# Patient Record
Sex: Male | Born: 1955 | Race: White | Hispanic: No | Marital: Married | Smoking: Never smoker
Health system: Southern US, Community
[De-identification: ages and names within clinical notes are randomized; demographics above are authoritative.]

## PROBLEM LIST (undated history)

## (undated) DIAGNOSIS — E7800 Pure hypercholesterolemia, unspecified: Secondary | ICD-10-CM

## (undated) DIAGNOSIS — B019 Varicella without complication: Secondary | ICD-10-CM

## (undated) DIAGNOSIS — N529 Male erectile dysfunction, unspecified: Secondary | ICD-10-CM

## (undated) DIAGNOSIS — K635 Polyp of colon: Secondary | ICD-10-CM

## (undated) HISTORY — DX: Polyp of colon: K63.5

## (undated) HISTORY — DX: Varicella without complication: B01.9

## (undated) HISTORY — DX: Pure hypercholesterolemia, unspecified: E78.00

## (undated) HISTORY — PX: GANGLION CYST EXCISION: SHX1691

## (undated) HISTORY — DX: Male erectile dysfunction, unspecified: N52.9

---

## 2015-06-07 ENCOUNTER — Encounter: Payer: Self-pay | Admitting: Adult Health

## 2015-06-07 ENCOUNTER — Ambulatory Visit (INDEPENDENT_AMBULATORY_CARE_PROVIDER_SITE_OTHER): Payer: Self-pay | Admitting: Adult Health

## 2015-06-07 VITALS — BP 110/78 | Temp 98.0°F | Ht 72.0 in | Wt 197.0 lb

## 2015-06-07 DIAGNOSIS — Z7189 Other specified counseling: Secondary | ICD-10-CM | POA: Diagnosis not present

## 2015-06-07 DIAGNOSIS — E7800 Pure hypercholesterolemia, unspecified: Secondary | ICD-10-CM | POA: Insufficient documentation

## 2015-06-07 DIAGNOSIS — Z7689 Persons encountering health services in other specified circumstances: Secondary | ICD-10-CM

## 2015-06-07 MED ORDER — ATORVASTATIN CALCIUM 40 MG PO TABS: 40.0000 mg | ORAL_TABLET | Freq: Every day | ORAL | Status: DC

## 2015-06-07 NOTE — Progress Notes (Deleted)
   Subjective:    Patient ID: Darryl MuraGlenn Scott, male    DOB: 02/04/1956, 60 y.o.   MRN: [id number]  HPI    Review of Systems     Objective:   Physical Exam        Assessment & Plan:

## 2015-06-07 NOTE — Patient Instructions (Addendum)
It was great meeting you today!  Follow up in March for your physical, but if you need anything in the meantime, please let me know.   Continue to work on diet and exercise.     Health Maintenance, Male A healthy lifestyle and preventative care can promote health and wellness.  Maintain regular health, dental, and eye exams.  Eat a healthy diet. Foods like vegetables, fruits, whole grains, low-fat dairy products, and lean protein foods contain the nutrients you need and are low in calories. Decrease your intake of foods high in solid fats, added sugars, and salt. Get information about a proper diet from your health care provider, if necessary.  Regular physical exercise is one of the most important things you can do for your health. Most adults should get at least 150 minutes of moderate-intensity exercise (any activity that increases your heart rate and causes you to sweat) each week. In addition, most adults need muscle-strengthening exercises on 2 or more days a week.   Maintain a healthy weight. The body mass index (BMI) is a screening tool to identify possible weight problems. It provides an estimate of body fat based on height and weight. Your health care provider can find your BMI and can help you achieve or maintain a healthy weight. For males 20 years and older:  A BMI below 18.5 is considered underweight.  A BMI of 18.5 to 24.9 is normal.  A BMI of 25 to 29.9 is considered overweight.  A BMI of 30 and above is considered obese.  Maintain normal blood lipids and cholesterol by exercising and minimizing your intake of saturated fat. Eat a balanced diet with plenty of fruits and vegetables. Blood tests for lipids and cholesterol should begin at age 62 and be repeated every 5 years. If your lipid or cholesterol levels are high, you are over age 62, or you are at high risk for heart disease, you may need your cholesterol levels checked more frequently.Ongoing high lipid and  cholesterol levels should be treated with medicines if diet and exercise are not working.  If you smoke, find out from your health care provider how to quit. If you do not use tobacco, do not start.  Lung cancer screening is recommended for adults aged 55-80 years who are at high risk for developing lung cancer because of a history of smoking. A yearly low-dose CT scan of the lungs is recommended for people who have at least a 30-pack-year history of smoking and are current smokers or have quit within the past 15 years. A pack year of smoking is smoking an average of 1 pack of cigarettes a day for 1 year (for example, a 30-pack-year history of smoking could mean smoking 1 pack a day for 30 years or 2 packs a day for 15 years). Yearly screening should continue until the smoker has stopped smoking for at least 15 years. Yearly screening should be stopped for people who develop a health problem that would prevent them from having lung cancer treatment.  If you choose to drink alcohol, do not have more than 2 drinks per day. One drink is considered to be 12 oz (360 mL) of beer, 5 oz (150 mL) of wine, or 1.5 oz (45 mL) of liquor.  Avoid the use of street drugs. Do not share needles with anyone. Ask for help if you need support or instructions about stopping the use of drugs.  High blood pressure causes heart disease and increases the risk of stroke.  High blood pressure is more likely to develop in:  People who have blood pressure in the end of the normal range (100-139/85-89 mm Hg).  People who are overweight or obese.  People who are African American.  If you are 53-75 years of age, have your blood pressure checked every 3-5 years. If you are 37 years of age or older, have your blood pressure checked every year. You should have your blood pressure measured twice--once when you are at a hospital or clinic, and once when you are not at a hospital or clinic. Record the average of the two measurements. To  check your blood pressure when you are not at a hospital or clinic, you can use:  An automated blood pressure machine at a pharmacy.  A home blood pressure monitor.  If you are 45-3 years old, ask your health care provider if you should take aspirin to prevent heart disease.  Diabetes screening involves taking a blood sample to check your fasting blood sugar level. This should be done once every 3 years after age 53 if you are at a normal weight and without risk factors for diabetes. Testing should be considered at a younger age or be carried out more frequently if you are overweight and have at least 1 risk factor for diabetes.  Colorectal cancer can be detected and often prevented. Most routine colorectal cancer screening begins at the age of 38 and continues through age 69. However, your health care provider may recommend screening at an earlier age if you have risk factors for colon cancer. On a yearly basis, your health care provider may provide home test kits to check for hidden blood in the stool. A small camera at the end of a tube may be used to directly examine the colon (sigmoidoscopy or colonoscopy) to detect the earliest forms of colorectal cancer. Talk to your health care provider about this at age 76 when routine screening begins. A direct exam of the colon should be repeated every 5-10 years through age 52, unless early forms of precancerous polyps or small growths are found.  People who are at an increased risk for hepatitis B should be screened for this virus. You are considered at high risk for hepatitis B if:  You were born in a country where hepatitis B occurs often. Talk with your health care provider about which countries are considered high risk.  Your parents were born in a high-risk country and you have not received a shot to protect against hepatitis B (hepatitis B vaccine).  You have HIV or AIDS.  You use needles to inject street drugs.  You live with, or have sex  with, someone who has hepatitis B.  You are a man who has sex with other men (MSM).  You get hemodialysis treatment.  You take certain medicines for conditions like cancer, organ transplantation, and autoimmune conditions.  Hepatitis C blood testing is recommended for all people born from [birth year] through 1965 and any individual with known risk factors for hepatitis C.  Healthy men should no longer receive prostate-specific antigen (PSA) blood tests as part of routine cancer screening. Talk to your health care provider about prostate cancer screening.  Testicular cancer screening is not recommended for adolescents or adult males who have no symptoms. Screening includes self-exam, a health care provider exam, and other screening tests. Consult with your health care provider about any symptoms you have or any concerns you have about testicular cancer.  Practice safe sex. Use condoms  and avoid high-risk sexual practices to reduce the spread of sexually transmitted infections (STIs).  You should be screened for STIs, including gonorrhea and chlamydia if:  You are sexually active and are younger than 24 years.  You are older than 24 years, and your health care provider tells you that you are at risk for this type of infection.  Your sexual activity has changed since you were last screened, and you are at an increased risk for chlamydia or gonorrhea. Ask your health care provider if you are at risk.  If you are at risk of being infected with HIV, it is recommended that you take a prescription medicine daily to prevent HIV infection. This is called pre-exposure prophylaxis (PrEP). You are considered at risk if:  You are a man who has sex with other men (MSM).  You are a heterosexual man who is sexually active with multiple partners.  You take drugs by injection.  You are sexually active with a partner who has HIV.  Talk with your health care provider about whether you are at high risk of being  infected with HIV. If you choose to begin PrEP, you should first be tested for HIV. You should then be tested every 3 months for as long as you are taking PrEP.  Use sunscreen. Apply sunscreen liberally and repeatedly throughout the day. You should seek shade when your shadow is shorter than you. Protect yourself by wearing long sleeves, pants, a wide-brimmed hat, and sunglasses year round whenever you are outdoors.  Tell your health care provider of new moles or changes in moles, especially if there is a change in shape or color. Also, tell your health care provider if a mole is larger than the size of a pencil eraser.  A one-time screening for abdominal aortic aneurysm (AAA) and surgical repair of large AAAs by ultrasound is recommended for men aged 25-75 years who are current or former smokers.  Stay current with your vaccines (immunizations).   This information is not intended to replace advice given to you by your health care provider. Make sure you discuss any questions you have with your health care provider.   Document Released: 11/04/2007 Document Revised: 05/29/2014 Document Reviewed: 10/03/2010 Elsevier Interactive Patient Education [employer].

## 2015-06-07 NOTE — Progress Notes (Signed)
HPI:  Darryl Scott is here to establish care. He is a very pleasant and active caucasian male who  has a past medical history of Chicken pox; High cholesterol; and Colon polyps. He has recently moved from the [place] area to [place] for work.    Last PCP and physical:Within last year. With Dr. Burman Blacksmith at [facility].    Has the following chronic problems that require follow up and concerns today:   Hypercholesterolemia  He has been taking Lipitor for this issue and has never had an issue before.   ROS negative for unless reported above: fevers, chills,feeling poorly, unintentional weight loss, hearing or vision loss, chest pain, palpitations, leg claudication, struggling to breath,Not feeling congested in the chest, no orthopenia, no cough,no wheezing, normal appetite, no soft tissue swelling, no hemoptysis, melena, hematochezia, hematuria, falls, loc, si, or thoughts of self harm.  Immunizations: Vaccinations Diet: Eats healthy Exercise: Runs three times a week. He likes to lift weights.  Colonoscopy: 2016 - One polyp   Past Medical History  Diagnosis Date  . Chicken pox   . High cholesterol   . Colon polyps     Past Surgical History  Procedure Laterality Date  . Ganglion cyst excision      Family History  Problem Relation Age of Onset  . Prostate cancer Father   . Elevated Lipids Father     Social History   Social History  . Marital Status: Married    Spouse Name: N/A  . Number of Children: N/A  . Years of Education: N/A   Social History Main Topics  . Smoking status: Never Smoker   . Smokeless tobacco: None  . Alcohol Use: 0.0 oz/week    0 Standard drinks or equivalent per week     Comment: per pt 1 beer a week   . Drug Use: No  . Sexual Activity: Not Asked   Other Topics Concern  . None   Social History Narrative     Current outpatient prescriptions:  .  aspirin 81 MG tablet, Take 81 mg by mouth daily., Disp: , Rfl:  .  atorvastatin  (LIPITOR) 40 MG tablet, Take 1 tablet (40 mg total) by mouth daily., Disp: 90 tablet, Rfl: 1  EXAM:  Filed Vitals:   06/07/15 0900  BP: 110/78  Temp: 98 F (36.7 C)    Body mass index is 26.71 kg/(m^2).  GENERAL: vitals reviewed and listed above, alert, oriented, appears well hydrated and in no acute distress  HEENT: atraumatic, conjunttiva clear, no obvious abnormalities on inspection of external nose and ears  NECK: Neck is soft and supple without masses, no adenopathy or thyromegaly, trachea midline, no JVD. Normal range of motion.   LUNGS: clear to auscultation bilaterally, no wheezes, rales or rhonchi, good air movement  CV: Regular rate and rhythm, normal S1/S2, no audible murmurs, gallops, or rubs. No carotid bruit and no peripheral edema.   MS: moves all extremities without noticeable abnormality. No edema noted  Abd: soft/nontender/nondistended/normal bowel sounds   Skin: warm and dry, no rash   Extremities: No clubbing, cyanosis, or edema. Capillary refill is WNL. Pulses intact bilaterally in upper and lower extremities.   Neuro: CN II-XII intact, sensation and reflexes normal throughout, 5/5 muscle strength in bilateral upper and lower extremities. Normal finger to nose. Normal rapid alternating movements. Normal romberg. No pronator drift.   PSYCH: pleasant and cooperative, no obvious depression or anxiety  ASSESSMENT AND PLAN:  1. Encounter to establish  care - Follow up in March for CPE - Follow up sooner if needed - Work on diet and exercise.   2. Pure hypercholesterolemia - atorvastatin (LIPITOR) 40 MG tablet; Take 1 tablet (40 mg total) by mouth daily.  Dispense: 90 tablet; Refill: 1   -We reviewed the PMH, PSH, FH, SH, Meds and Allergies. -We provided refills for any medications we will prescribe as needed. -We addressed current concerns per orders and patient instructions. -We have asked for records for pertinent exams, studies, vaccines and notes  from previous providers. -We have advised patient to follow up per instructions below.   -Patient advised to return or notify a provider immediately if symptoms worsen or persist or new concerns arise.    [employer]ory Andranik Jeune

## 2015-06-07 NOTE — Progress Notes (Signed)
Pre visit review using our clinic review tool, if applicable. No additional management support is needed unless otherwise documented below in the visit note. 

## 2015-07-26 ENCOUNTER — Ambulatory Visit (INDEPENDENT_AMBULATORY_CARE_PROVIDER_SITE_OTHER): Payer: PRIVATE HEALTH INSURANCE | Admitting: Adult Health

## 2015-07-26 ENCOUNTER — Encounter: Payer: Self-pay | Admitting: Adult Health

## 2015-07-26 VITALS — BP 120/80 | Temp 98.0°F | Ht 72.0 in | Wt 196.2 lb

## 2015-07-26 DIAGNOSIS — R001 Bradycardia, unspecified: Secondary | ICD-10-CM

## 2015-07-26 DIAGNOSIS — M25512 Pain in left shoulder: Secondary | ICD-10-CM | POA: Diagnosis not present

## 2015-07-26 DIAGNOSIS — Z0001 Encounter for general adult medical examination with abnormal findings: Secondary | ICD-10-CM

## 2015-07-26 DIAGNOSIS — Z0000 Encounter for general adult medical examination without abnormal findings: Secondary | ICD-10-CM | POA: Diagnosis not present

## 2015-07-26 DIAGNOSIS — E7800 Pure hypercholesterolemia, unspecified: Secondary | ICD-10-CM | POA: Diagnosis not present

## 2015-07-26 LAB — HEPATIC FUNCTION PANEL
ALT: 22 U/L (ref 0–53)
AST: 26 U/L (ref 0–37)
Albumin: 4.4 g/dL (ref 3.5–5.2)
Alkaline Phosphatase: 74 U/L (ref 39–117)
BILIRUBIN TOTAL: 0.7 mg/dL (ref 0.2–1.2)
Bilirubin, Direct: 0.1 mg/dL (ref 0.0–0.3)
Total Protein: 7.4 g/dL (ref 6.0–8.3)

## 2015-07-26 LAB — CBC
HCT: 47.1 % (ref 39.0–52.0)
HEMOGLOBIN: 16 g/dL (ref 13.0–17.0)
MCHC: 34 g/dL (ref 30.0–36.0)
MCV: 89.3 fl (ref 78.0–100.0)
PLATELETS: 220 10*3/uL (ref 150.0–400.0)
RBC: 5.28 Mil/uL (ref 4.22–5.81)
RDW: 13.2 % (ref 11.5–15.5)
WBC: 7.7 10*3/uL (ref 4.0–10.5)

## 2015-07-26 LAB — POC URINALSYSI DIPSTICK (AUTOMATED)
BILIRUBIN UA: NEGATIVE
Glucose, UA: NEGATIVE
KETONES UA: NEGATIVE
Leukocytes, UA: NEGATIVE
Nitrite, UA: NEGATIVE
PH UA: 5.5
Protein, UA: NEGATIVE
Spec Grav, UA: 1.025
Urobilinogen, UA: 0.2

## 2015-07-26 LAB — HEMOGLOBIN A1C: HEMOGLOBIN A1C: 5.8 % (ref 4.6–6.5)

## 2015-07-26 LAB — BASIC METABOLIC PANEL
BUN: 18 mg/dL (ref 6–23)
CALCIUM: 9.5 mg/dL (ref 8.4–10.5)
CO2: 27 mEq/L (ref 19–32)
CREATININE: 1.12 mg/dL (ref 0.40–1.50)
Chloride: 104 mEq/L (ref 96–112)
GFR: 70.63 mL/min (ref >60.00)
Glucose, Bld: 97 mg/dL (ref 70–99)
Potassium: 4.8 mEq/L (ref 3.5–5.1)
Sodium: 139 mEq/L (ref 135–145)

## 2015-07-26 LAB — PSA: PSA: 1.42 ng/mL (ref 0.10–4.00)

## 2015-07-26 LAB — LIPID PANEL
CHOL/HDL RATIO: 3
Cholesterol: 163 mg/dL (ref 0–200)
HDL: 50.5 mg/dL (ref >39.00)
LDL CALC: 99 mg/dL (ref 0–99)
NONHDL: 112.27
Triglycerides: 67 mg/dL (ref 0.0–149.0)
VLDL: 13.4 mg/dL (ref 0.0–40.0)

## 2015-07-26 LAB — TSH: TSH: 1.85 u[IU]/mL (ref 0.35–4.50)

## 2015-07-26 MED ORDER — CYCLOBENZAPRINE HCL 10 MG PO TABS: 10.0000 mg | ORAL_TABLET | Freq: Three times a day (TID) | ORAL | Status: DC | PRN

## 2015-07-26 NOTE — Patient Instructions (Signed)
It was great seeing you today!  I will follow up with you regarding your blood work.   I have sent in a prescription for Flexeril 10 mg, take this at night first to see how it makes you feel.   Someone will call you to schedule your appointment for Sports Medicine.   Follow up in one year or sooner if needed.   Enjoy your summer on the trails.

## 2015-07-26 NOTE — Progress Notes (Signed)
Subjective:    Patient ID: Darryl Scott, male    DOB: 11/09/1955, 60 y.o.   MRN: [id number]  HPI  Patient presents for yearly preventative medicine examination.   All immunizations and health maintenance protocols were reviewed with the patient and needed orders were placed.  Appropriate screening laboratory values were ordered for the patient including screening of hyperlipidemia, renal function and hepatic function. If indicated by BPH, a PSA was ordered.  Medication reconciliation,  past medical history, social history, problem list and allergies were reviewed in detail with the patient  Goals were established with regard to weight loss, exercise, and  diet in compliance with medications  End of life planning was discussed. He has an advanced directive and living will.   Acute Issues - He has had left shoulder pain for 5 months. He contributed his pain to his recent move, but feels as though 5 months is to long to have this pain. He has limited range of motion and when he lifts his arm from his side he can go about half way before he has pain. The pain radiates down the side of his deltoid. Does not feel like the pain is coming from the inside of his shoulder. No pain at rest but has pain when he sleeps on that side.     Review of Systems  Constitutional: Negative.   HENT: Negative.   Eyes: Negative.   Respiratory: Negative.   Cardiovascular: Negative.   Gastrointestinal: Negative.   Endocrine: Negative.   Genitourinary: Negative.   Musculoskeletal: Positive for myalgias.  Allergic/Immunologic: Negative.   Neurological: Negative.   Hematological: Negative.   Psychiatric/Behavioral: Negative.   All other systems reviewed and are negative.  Past Medical History  Diagnosis Date  . Chicken pox   . High cholesterol   . Colon polyps     Social History   Social History  . Marital Status: Married    Spouse Name: N/A  . Number of Children: N/A  . Years of Education:  N/A   Occupational History  . Not on file.   Social History Main Topics  . Smoking status: Never Smoker   . Smokeless tobacco: Not on file  . Alcohol Use: 0.0 oz/week    0 Standard drinks or equivalent per week     Comment: per pt 1 beer a week   . Drug Use: No  . Sexual Activity: Not on file   Other Topics Concern  . 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    Married for 36 years    One child, she lives in [place].        Past Surgical History  Procedure Laterality Date  . Ganglion cyst excision      Family History  Problem Relation Age of Onset  . Prostate cancer Father   . Elevated Lipids Father     No Known Allergies  Current Outpatient Prescriptions on File Prior to Visit  Medication Sig Dispense Refill  . aspirin 81 MG tablet Take 81 mg by mouth daily.    Marland Kitchen. atorvastatin (LIPITOR) 40 MG tablet Take 1 tablet (40 mg total) by mouth daily. 90 tablet 1   No current facility-administered medications on file prior to visit.    BP 120/80 mmHg  Temp(Src) 98 F (36.7 C) (Oral)  Ht 6' (1.829 m)  Wt 196 lb 3.2 oz (88.996 kg)  BMI 26.60 kg/m2  Objective:   Physical Exam  Constitutional: He is oriented to person, place, and time. He appears well-developed and well-nourished. No distress.  HENT:  Head: Normocephalic and atraumatic.  Right Ear: External ear normal.  Left Ear: External ear normal.  Nose: Nose normal.  Mouth/Throat: Oropharynx is clear and moist. No oropharyngeal exudate.  Eyes: Right eye exhibits no discharge. Left eye exhibits no discharge.  Neck: Normal range of motion. Neck supple. No tracheal deviation present.  Cardiovascular: Normal rate, regular rhythm, normal heart sounds and intact distal pulses.  Exam reveals no gallop and no friction rub.   No murmur heard. Pulmonary/Chest: Effort normal and breath sounds normal. No respiratory distress. He has no wheezes. He has  no rales. He exhibits no tenderness.  Abdominal: Soft. Bowel sounds are normal. He exhibits no distension. There is no tenderness. There is no rebound and no guarding.  Genitourinary: Rectum normal, prostate normal and penis normal. Guaiac negative stool. No penile tenderness.  asymmetrical prostate -has been told this before and has had biopsy. No masses or lumps felt.   Musculoskeletal: He exhibits no edema or tenderness.       Left shoulder: He exhibits decreased range of motion, pain and decreased strength. He exhibits no tenderness, no bony tenderness, no swelling, no effusion, no crepitus, no deformity and no spasm.  Lymphadenopathy:    He has no cervical adenopathy.  Neurological: He is alert and oriented to person, place, and time. He has normal reflexes. He displays normal reflexes. No cranial nerve deficit. He exhibits normal muscle tone. Coordination normal.  Skin: Skin is warm and dry. No rash noted. No erythema. No pallor.  Psychiatric: He has a normal mood and affect. His behavior is normal. Judgment and thought content normal.  Nursing note and vitals reviewed.     Assessment & Plan:  1. Routine general medical examination at a health care facility - Lipid panel - TSH - Hepatic function panel - Hemoglobin A1c - EKG 12-Lead- Sinus  Bradycardia, Rate 58 - CBC - Basic metabolic panel - PSA - Hep C Antibody - Follow up in one year or sooner if needed 2. Pure hypercholesterolemia - Lipid panel - TSH - Hepatic function panel - Hemoglobin A1c - EKG 12-Lead - CBC - Basic metabolic panel - PSA 3. Left shoulder pain - Does not appear to be rotator cuff, seems more tendon or ligament in nature. Possibly muscle strain but this appears to be a long time of recovery for muscle strain. Do not think it is bursitis.  - Ambulatory referral to Sports Medicine - cyclobenzaprine (FLEXERIL) 10 MG tablet; Take 1 tablet (10 mg total) by mouth 3 (three) times daily as needed for muscle  spasms.  Dispense: 30 tablet; Refill: 0

## 2015-07-26 NOTE — Addendum Note (Signed)
Addended by: Nancy FetterNAFZIGER, Bearl Talarico L on: 07/26/2015 09:11 AM   Modules accepted: Orders, SmartSet

## 2015-07-26 NOTE — Progress Notes (Signed)
Pre visit review using our clinic review tool, if applicable. No additional management support is needed unless otherwise documented below in the visit note. 

## 2015-07-27 LAB — HEPATITIS C ANTIBODY: HCV AB: NEGATIVE

## 2015-08-10 ENCOUNTER — Ambulatory Visit: Payer: PRIVATE HEALTH INSURANCE | Admitting: Family Medicine

## 2015-09-23 ENCOUNTER — Encounter: Payer: Self-pay | Admitting: Family Medicine

## 2015-09-23 ENCOUNTER — Ambulatory Visit (INDEPENDENT_AMBULATORY_CARE_PROVIDER_SITE_OTHER): Payer: Managed Care, Other (non HMO) | Admitting: Family Medicine

## 2015-09-23 ENCOUNTER — Ambulatory Visit (INDEPENDENT_AMBULATORY_CARE_PROVIDER_SITE_OTHER)
Admission: RE | Admit: 2015-09-23 | Discharge: 2015-09-23 | Disposition: A | Discharge status: Home / Self Care | Payer: Managed Care, Other (non HMO) | Source: Ambulatory Visit | Attending: Family Medicine | Admitting: Family Medicine

## 2015-09-23 ENCOUNTER — Ambulatory Visit: Payer: Self-pay

## 2015-09-23 VITALS — BP 130/80 | HR 77 | Ht 72.0 in | Wt 196.0 lb

## 2015-09-23 DIAGNOSIS — M25512 Pain in left shoulder: Secondary | ICD-10-CM

## 2015-09-23 DIAGNOSIS — M75122 Complete rotator cuff tear or rupture of left shoulder, not specified as traumatic: Secondary | ICD-10-CM | POA: Insufficient documentation

## 2015-09-23 NOTE — Assessment & Plan Note (Signed)
She does have what appears to be a very large tear. Significant weakness and loss of motion. I do think that x-rays as well as advance imaging is necessary. Patient will likely need surgical intervention. We discussed over-the-counter medicines. Patient declined any other medications to help with pain. We discussed if worsening symptoms to seek medical attention. We will answer any questions patient has from here on out and we will give him results of MRI.

## 2015-09-23 NOTE — Patient Instructions (Signed)
Good to see you  Sorry for the bad news Ice 20 minutes 2 times daily. Usually after activity and before bed. I am pretty positive you have a large rotator cuff tear Vitamin D 2000 IU daily  Turmeric 500mg  twice daily  Xray downstairs  MRI will call you  I will send message after MRI and tell you next step

## 2015-09-23 NOTE — Progress Notes (Signed)
Pre visit review using our clinic review tool, if applicable. No additional management support is needed unless otherwise documented below in the visit note. 

## 2015-09-23 NOTE — Progress Notes (Signed)
[facility] [address] [place], [place] [address] Phone: [phone] Subjective:    I'm seeing this patient by the request  of:  Shirline Frees, NP   CC: left shoulder pain  BJY:NWGNFAOZHY Darryl Scott is a 60 y.o. male coming in with complaint of left shoulder pain. Been going on for proximally 7 months. Patient was moving boxes to move here and may be had some type injury. Seems to be worsening over the course of time. Patient is actually lacking more range of motion now as well. Patient rates the severity of pain as 9 out of 10. Feels that he has significant weakness as well. Patient is noticing that it is affecting daily activities. Unable to do anything more than his regular daily activities but is having trouble with even dressing. Mild radiation down the arm from time to time which is more of a throbbing sensation but denies any neck pain. Has tried ibuprofen a couple times but tries to avoid any type of medications.     Past Medical History  Diagnosis Date  . Chicken pox   . High cholesterol   . Colon polyps    Past Surgical History  Procedure Laterality Date  . Ganglion cyst excision     Social History   Social History  . Marital Status: Married    Spouse Name: N/A  . Number of Children: N/A  . Years of Education: N/A   Social History Main Topics  . Smoking status: Never Smoker   . Smokeless tobacco: None  . Alcohol Use: 0.0 oz/week    0 Standard drinks or equivalent per week     Comment: per pt 1 beer a week   . Drug Use: No  . Sexual Activity: Not Asked   Other Topics Concern  . 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    Married for 36 years    One child, she lives in [place].       No Known Allergies Family History  Problem Relation Age of Onset  . Prostate cancer Father   . Elevated Lipids Father     Past medical history, social, surgical and family history all  reviewed in electronic medical record.  No pertanent information unless stated regarding to the chief complaint.   Review of Systems: No headache, visual changes, nausea, vomiting, diarrhea, constipation, dizziness, abdominal pain, skin rash, fevers, chills, night sweats, weight loss, swollen lymph nodes, body aches, joint swelling, muscle aches, chest pain, shortness of breath, mood changes.   Objective Blood pressure 130/80, pulse 77, height 6' (1.829 m), weight 196 lb (88.905 kg), SpO2 97 %.  General: No apparent distress alert and oriented x3 mood and affect normal, dressed appropriately.  HEENT: Pupils equal, extraocular movements intact  Respiratory: Patient's speak in full sentences and does not appear short of breath  Cardiovascular: No lower extremity edema, non tender, no erythema  Skin: Warm dry intact with no signs of infection or rash on extremities or on axial skeleton.  Abdomen: Soft nontender  Neuro: Cranial nerves II through XII are intact, neurovascularly intact in all extremities with 2+ DTRs and 2+ pulses.  Lymph: No lymphadenopathy of posterior or anterior cervical chain or axillae bilaterally.  Gait normal with good balance and coordination.  MSK:  Non tender with full range of motion and good stability and symmetric strength and tone of elbows, wrist, hip, knee and ankles  bilaterally.  Shoulder: left Inspection reveals no abnormalities, atrophy or asymmetry. Palpation is normal with no tenderness over AC joint or bicipital groove. Decreased range of motion lacking the last 20 of forward flexion, internal rotation only to lateral hip Rotation of 5 Rotator cuff strength normal throughout. signs of impingement with positive Neer and Hawkin's tests, but negative empty can sign. Speeds and Yergason's tests normal. Positive O'Brien's Normal scapular function observed. Positive drop arm and painful arc Positive apprehension Contralateral shoulder unremarkable  MSK US  performed of: left This study was ordered, performed, and interpreted by Terrilee FilesZach Varvara Legault D.O.  Shoulder:   Supraspinatus:  Large full-thickness tear with a proximal a 1 cm of retraction noted. Atrophy and some fat deposition in the tenderness noted Infraspinatus:  Appears normal on long and transverse views. Significant increase in Doppler flow Subscapularis:  Partial tear noted with increasing hypoechoic changes. Teres Minor:  Appears normal on long and transverse views. AC joint:  Mild arthritis Glenohumeral Joint:  Appears normal without effusion. Glenoid Labrum:  Intact without visualized tears. Biceps Tendon: hypoechoic changes but no true tear  Impression: large retracted full-thickness supraspinatus tear      Impression and Recommendations:     This case required medical decision making of moderate complexity.      Note: This dictation was prepared with Dragon dictation along with smaller phrase technology. Any transcriptional errors that result from this process are unintentional.

## 2015-09-30 ENCOUNTER — Ambulatory Visit
Admission: RE | Admit: 2015-09-30 | Discharge: 2015-09-30 | Disposition: A | Discharge status: Home / Self Care | Payer: Managed Care, Other (non HMO) | Source: Ambulatory Visit | Attending: Family Medicine | Admitting: Family Medicine

## 2015-09-30 DIAGNOSIS — M25512 Pain in left shoulder: Secondary | ICD-10-CM

## 2015-10-07 ENCOUNTER — Ambulatory Visit (INDEPENDENT_AMBULATORY_CARE_PROVIDER_SITE_OTHER): Payer: Managed Care, Other (non HMO) | Admitting: Family Medicine

## 2015-10-07 ENCOUNTER — Encounter: Payer: Self-pay | Admitting: Family Medicine

## 2015-10-07 ENCOUNTER — Other Ambulatory Visit: Payer: Self-pay

## 2015-10-07 VITALS — BP 134/86 | HR 104 | Ht 72.0 in | Wt 196.0 lb

## 2015-10-07 DIAGNOSIS — M7500 Adhesive capsulitis of unspecified shoulder: Secondary | ICD-10-CM | POA: Insufficient documentation

## 2015-10-07 DIAGNOSIS — M25512 Pain in left shoulder: Secondary | ICD-10-CM | POA: Diagnosis not present

## 2015-10-07 DIAGNOSIS — M19019 Primary osteoarthritis, unspecified shoulder: Secondary | ICD-10-CM | POA: Insufficient documentation

## 2015-10-07 DIAGNOSIS — M129 Arthropathy, unspecified: Secondary | ICD-10-CM | POA: Diagnosis not present

## 2015-10-07 DIAGNOSIS — M7502 Adhesive capsulitis of left shoulder: Secondary | ICD-10-CM

## 2015-10-07 NOTE — Assessment & Plan Note (Signed)
Patient was given an injection today. Tolerated the procedure well with some mild increase in range of motion immediately. Patient will respond. Referred to formal physical therapy for the frozen shoulder as well. 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 I did this is less likely to give him the pain but will consider it. Patient will return and see me again in 3-4 weeks for further evaluation.

## 2015-10-07 NOTE — Progress Notes (Signed)
[facility] [address] [place], [place] [address] Phone: [phone] Subjective:    I'm seeing this patient by the request  of:  Shirline Freesory Nafziger, NP   CC: left shoulder pain f/u   BJY:NWGNFAOZHYHPI:Subjective Rhetta MuraGlenn Bellerose is a 60 y.o. male coming in with complaint of left shoulder pain.appeared on ultrasound the patient did have a full-thickness tear. Patient elected to have an MRI. Patient's MRI showed that he only had mild tendinosis as well as a large bursitis. Patient is here for further evaluation. Still has not had any improvement in range of motion or strength. Continues to have the pain. Wondering what is next.     Past Medical History  Diagnosis Date  . Chicken pox   . High cholesterol   . Colon polyps    Past Surgical History  Procedure Laterality Date  . Ganglion cyst excision     Social History   Social History  . Marital Status: Married    Spouse Name: N/A  . Number of Children: N/A  . Years of Education: N/A   Social History Main Topics  . Smoking status: Never Smoker   . Smokeless tobacco: None  . Alcohol Use: 0.0 oz/week    0 Standard drinks or equivalent per week     Comment: per pt 1 beer a week   . Drug Use: No  . Sexual Activity: Not Asked   Other Topics Concern  . 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    Married for 36 years    One child, she lives in [place].       No Known Allergies Family History  Problem Relation Age of Onset  . Prostate cancer Father   . Elevated Lipids Father     Past medical history, social, surgical and family history all reviewed in electronic medical record.  No pertanent information unless stated regarding to the chief complaint.   Review of Systems: No headache, visual changes, nausea, vomiting, diarrhea, constipation, dizziness, abdominal pain, skin rash, fevers, chills, night sweats, weight loss, swollen lymph nodes, body aches,  joint swelling, muscle aches, chest pain, shortness of breath, mood changes.   Objective Blood pressure 134/86, pulse 104, height 6' (1.829 m), weight 196 lb (88.905 kg), SpO2 98 %.  General: No apparent distress alert and oriented x3 mood and affect normal, dressed appropriately.  HEENT: Pupils equal, extraocular movements intact  Respiratory: Patient's speak in full sentences and does not appear short of breath  Cardiovascular: No lower extremity edema, non tender, no erythema  Skin: Warm dry intact with no signs of infection or rash on extremities or on axial skeleton.  Abdomen: Soft nontender  Neuro: Cranial nerves II through XII are intact, neurovascularly intact in all extremities with 2+ DTRs and 2+ pulses.  Lymph: No lymphadenopathy of posterior or anterior cervical chain or axillae bilaterally.  Gait normal with good balance and coordination.  MSK:  Non tender with full range of motion and good stability and symmetric strength and tone of elbows, wrist, hip, knee and ankles bilaterally.  Shoulder: left Inspection reveals no abnormalities, atrophy or asymmetry. Palpation is normal with no tenderness over AC joint or bicipital groove. Decreased range of motion lacking the last 20 of forward flexion, internal rotation only to lateral hip Rotation of 5 Rotator cuff strength 4 out of 5 compared to the contralateral side signs of impingement with positive Neer and  Hawkin's tests, but negative empty can sign. Speeds and Yergason's tests normal. Positive O'Brien's Normal scapular function observed. Positive drop arm and painful arc Positive apprehension Contralateral shoulder unremarkable  Procedure: Real-time Ultrasound Guided Injection of left glenohumeral joint Device: GE Logiq E  Ultrasound guided injection is preferred based studies that show increased duration, increased effect, greater accuracy, decreased procedural pain, increased response rate with ultrasound guided versus  blind injection.  Verbal informed consent obtained.  Time-out conducted.  Noted no overlying erythema, induration, or other signs of local infection.  Skin prepped in a sterile fashion.  Local anesthesia: Topical Ethyl chloride.  With sterile technique and under real time ultrasound guidance:  Joint visualized.  23g 1  inch needle inserted posterior approach. Pictures taken for needle placement. Patient did have injection of 2 cc of 1% lidocaine, 2 cc of 0.5% Marcaine, and 1cc of Kenalog 40 mg/dL. Completed without difficulty  Pain immediately resolved suggesting accurate placement of the medication.  Advised to call if fevers/chills, erythema, induration, drainage, or persistent bleeding.  Images permanently stored and available for review in the ultrasound unit.  Impression: Technically successful ultrasound guided injection.     Impression and Recommendations:     This case required medical decision making of moderate complexity.      Note: This dictation was prepared with Dragon dictation along with smaller phrase technology. Any transcriptional errors that result from this process are unintentional.

## 2015-10-07 NOTE — Patient Instructions (Signed)
Good to see yo u Darryl Scott is your friend Stay active PT will be calling you I am hoping this is frozen shoulder See me again in 3-4 weeks and we will rescan again and make sure you are doig better.

## 2015-11-16 ENCOUNTER — Other Ambulatory Visit: Payer: Self-pay | Admitting: General Practice

## 2015-11-16 DIAGNOSIS — E7800 Pure hypercholesterolemia, unspecified: Secondary | ICD-10-CM

## 2015-11-16 MED ORDER — ATORVASTATIN CALCIUM 40 MG PO TABS: 40.0000 mg | ORAL_TABLET | Freq: Every day | ORAL | Status: DC

## 2016-06-06 ENCOUNTER — Telehealth: Payer: Self-pay | Admitting: Adult Health

## 2016-06-06 ENCOUNTER — Other Ambulatory Visit: Payer: Self-pay

## 2016-06-06 DIAGNOSIS — E7800 Pure hypercholesterolemia, unspecified: Secondary | ICD-10-CM

## 2016-06-06 MED ORDER — ATORVASTATIN CALCIUM 40 MG PO TABS: 40.0000 mg | ORAL_TABLET | Freq: Every day | ORAL | 1 refills | Status: DC

## 2016-06-06 NOTE — Telephone Encounter (Signed)
° ° ° °  Pt request refill of the following:  atorvastatin (LIPITOR) 40 MG tablet   Phamacy:   [facility]

## 2016-06-06 NOTE — Telephone Encounter (Signed)
Rx has been sent in. 

## 2016-07-31 ENCOUNTER — Other Ambulatory Visit (INDEPENDENT_AMBULATORY_CARE_PROVIDER_SITE_OTHER): Payer: Managed Care, Other (non HMO)

## 2016-07-31 DIAGNOSIS — Z0000 Encounter for general adult medical examination without abnormal findings: Secondary | ICD-10-CM | POA: Diagnosis not present

## 2016-07-31 LAB — LIPID PANEL
CHOLESTEROL: 135 mg/dL (ref 0–200)
HDL: 38.1 mg/dL — ABNORMAL LOW (ref >39.00)
LDL Cholesterol: 81 mg/dL (ref 0–99)
NonHDL: 96.4
Total CHOL/HDL Ratio: 4
Triglycerides: 78 mg/dL (ref 0.0–149.0)
VLDL: 15.6 mg/dL (ref 0.0–40.0)

## 2016-07-31 LAB — CBC WITH DIFFERENTIAL/PLATELET
BASOS ABS: 0.1 10*3/uL (ref 0.0–0.1)
Basophils Relative: 0.9 % (ref 0.0–3.0)
EOS PCT: 6.5 % — AB (ref 0.0–5.0)
Eosinophils Absolute: 0.5 10*3/uL (ref 0.0–0.7)
HEMATOCRIT: 48.4 % (ref 39.0–52.0)
HEMOGLOBIN: 16.3 g/dL (ref 13.0–17.0)
LYMPHS PCT: 26.1 % (ref 12.0–46.0)
Lymphs Abs: 1.9 10*3/uL (ref 0.7–4.0)
MCHC: 33.8 g/dL (ref 30.0–36.0)
MCV: 91 fl (ref 78.0–100.0)
MONOS PCT: 8.9 % (ref 3.0–12.0)
Monocytes Absolute: 0.6 10*3/uL (ref 0.1–1.0)
Neutro Abs: 4.1 10*3/uL (ref 1.4–7.7)
Neutrophils Relative %: 57.6 % (ref 43.0–77.0)
Platelets: 208 10*3/uL (ref 150.0–400.0)
RBC: 5.32 Mil/uL (ref 4.22–5.81)
RDW: 13.3 % (ref 11.5–15.5)
WBC: 7.1 10*3/uL (ref 4.0–10.5)

## 2016-07-31 LAB — POC URINALSYSI DIPSTICK (AUTOMATED)
Bilirubin, UA: NEGATIVE
GLUCOSE UA: NEGATIVE
Ketones, UA: NEGATIVE
Leukocytes, UA: NEGATIVE
NITRITE UA: NEGATIVE
PROTEIN UA: NEGATIVE
Spec Grav, UA: >=1.03
UROBILINOGEN UA: 0.2
pH, UA: 5

## 2016-07-31 LAB — BASIC METABOLIC PANEL
BUN: 16 mg/dL (ref 6–23)
CO2: 29 mEq/L (ref 19–32)
Calcium: 9.4 mg/dL (ref 8.4–10.5)
Chloride: 107 mEq/L (ref 96–112)
Creatinine, Ser: 1.13 mg/dL (ref 0.40–1.50)
GFR: 69.68 mL/min (ref >60.00)
GLUCOSE: 99 mg/dL (ref 70–99)
POTASSIUM: 4.8 meq/L (ref 3.5–5.1)
SODIUM: 141 meq/L (ref 135–145)

## 2016-07-31 LAB — HEPATIC FUNCTION PANEL
ALBUMIN: 4.1 g/dL (ref 3.5–5.2)
ALK PHOS: 77 U/L (ref 39–117)
ALT: 17 U/L (ref 0–53)
AST: 22 U/L (ref 0–37)
Bilirubin, Direct: 0.1 mg/dL (ref 0.0–0.3)
Total Bilirubin: 0.6 mg/dL (ref 0.2–1.2)
Total Protein: 7 g/dL (ref 6.0–8.3)

## 2016-08-01 LAB — PSA: PSA: 2.16 ng/mL (ref 0.10–4.00)

## 2016-08-01 LAB — TSH: TSH: 1.7 u[IU]/mL (ref 0.35–4.50)

## 2016-08-04 ENCOUNTER — Ambulatory Visit (INDEPENDENT_AMBULATORY_CARE_PROVIDER_SITE_OTHER): Payer: Managed Care, Other (non HMO) | Admitting: Adult Health

## 2016-08-04 VITALS — BP 132/84 | Temp 97.7°F | Ht 72.0 in | Wt 195.4 lb

## 2016-08-04 DIAGNOSIS — E7800 Pure hypercholesterolemia, unspecified: Secondary | ICD-10-CM

## 2016-08-04 DIAGNOSIS — N401 Enlarged prostate with lower urinary tract symptoms: Secondary | ICD-10-CM

## 2016-08-04 DIAGNOSIS — N138 Other obstructive and reflux uropathy: Secondary | ICD-10-CM

## 2016-08-04 DIAGNOSIS — N529 Male erectile dysfunction, unspecified: Secondary | ICD-10-CM

## 2016-08-04 DIAGNOSIS — Z0000 Encounter for general adult medical examination without abnormal findings: Secondary | ICD-10-CM | POA: Diagnosis not present

## 2016-08-04 DIAGNOSIS — H9313 Tinnitus, bilateral: Secondary | ICD-10-CM

## 2016-08-04 MED ORDER — TADALAFIL 20 MG PO TABS: 10.0000 mg | ORAL_TABLET | ORAL | 11 refills | Status: DC | PRN

## 2016-08-04 NOTE — Progress Notes (Signed)
Subjective:    Patient ID: Darryl Scott, male    DOB: 10-Nov-1955, 61 y.o.   MRN: [id number]  HPI  Patient presents for yearly preventative medicine examination. He is a pleasant 61 year old male who  has a past medical history of Chicken pox; Colon polyps; and High cholesterol.   All immunizations and health maintenance protocols were reviewed with the patient and needed orders were placed.  Appropriate screening laboratory values were ordered for the patient including screening of hyperlipidemia, renal function and hepatic function. If indicated by BPH, a PSA was ordered.  Medication reconciliation,  past medical history, social history, problem list and allergies were reviewed in detail with the patient  Goals were established with regard to weight loss, exercise, and  diet in compliance with medications. He exercises multiple times per week and eats a healthy diet  He has an advanced directive and living will.   His last colonoscopy was in 2016. He has seen his dentist and eye doctor this year.   Takes Lipitor for hyperlipidemia.   He reports that over the last 7 months he has had issues with urination. He reports that he has decreased stream, frequency, and feeling as though he is not emptying his bladder fully. He denies nocturia.   He also report that over the last 5 months he has had issues with getting an erection and maintaining an erection.   Tinnitus - noticed this happening daily " a few months ago" . Denies any trauma. Ringing is in both ears. He takes daily ASA.     Review of Systems  Constitutional: Negative.   HENT: Positive for tinnitus.   Eyes: Negative.   Respiratory: Negative.   Cardiovascular: Negative.   Gastrointestinal: Negative.   Endocrine: Negative.   Genitourinary: Positive for decreased urine volume, frequency and urgency.  Musculoskeletal: Negative.   Skin: Negative.   Allergic/Immunologic: Negative.   Neurological: Negative.   Hematological:  Negative.   Psychiatric/Behavioral: Negative.   All other systems reviewed and are negative.  Past Medical History:  Diagnosis Date  . Chicken pox   . Colon polyps   . High cholesterol     Social History   Social History  . Marital status: Married    Spouse name: N/A  . Number of children: N/A  . Years of education: N/A   Occupational History  . Not on file.   Social History Main Topics  . Smoking status: Never Smoker  . Smokeless tobacco: Not on file  . Alcohol use 0.0 oz/week     Comment: per pt 1 beer a week   . Drug use: No  . Sexual activity: Not on file   Other Topics Concern  . 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        Past Surgical History:  Procedure Laterality Date  . GANGLION CYST EXCISION      Family History  Problem Relation Age of Onset  . Prostate cancer Father   . Elevated Lipids Father     No Known Allergies  Current Outpatient Prescriptions on File Prior to Visit  Medication Sig Dispense Refill  . aspirin 81 MG tablet Take 81 mg by mouth daily.    Marland Kitchen atorvastatin (LIPITOR) 40 MG tablet Take 1 tablet (40 mg total) by mouth daily. 90 tablet 1  No current facility-administered medications on file prior to visit.     BP 132/84 (BP Location: Right Arm, Patient Position: Sitting, Cuff Size: Normal)   Temp 97.7 F (36.5 C) (Oral)   Ht 6' (1.829 m)   Wt 195 lb 6.4 oz (88.6 kg)   BMI 26.50 kg/m       Objective:   Physical Exam  Constitutional: He is oriented to person, place, and time. He appears well-developed and well-nourished. No distress.  HENT:  Head: Normocephalic and atraumatic.  Right Ear: External ear normal.  Left Ear: External ear normal.  Nose: Nose normal.  Mouth/Throat: Uvula is midline and oropharynx is clear and moist. No oropharyngeal exudate.  Eyes: Conjunctivae and EOM are normal.  Pupils are equal, round, and reactive to light. Right eye exhibits no discharge. Left eye exhibits no discharge. No scleral icterus.  Neck: Normal range of motion. Neck supple. No JVD present. No tracheal deviation present. No thyromegaly present.  Cardiovascular: Normal rate, regular rhythm, normal heart sounds and intact distal pulses.  Exam reveals no gallop and no friction rub.   No murmur heard. Pulmonary/Chest: Effort normal and breath sounds normal. No stridor. No respiratory distress. He has no wheezes. He has no rales. He exhibits no tenderness.  Abdominal: Soft. Bowel sounds are normal. He exhibits no distension and no mass. There is no tenderness. There is no rebound and no guarding.  Genitourinary: Rectum normal. Rectal exam shows guaiac negative stool. Prostate is enlarged. Prostate is not tender. No penile tenderness.  Genitourinary Comments: asymmetrical prostate. No masses or lumps felt  Musculoskeletal: Normal range of motion. He exhibits no edema, tenderness or deformity.  Lymphadenopathy:    He has no cervical adenopathy.  Neurological: He is alert and oriented to person, place, and time. He has normal reflexes. He displays normal reflexes. No cranial nerve deficit. He exhibits normal muscle tone. Coordination normal.  Skin: Skin is warm and dry. No rash noted. He is not diaphoretic. No erythema. No pallor.  Scattered AK's and nevi. No concern for malignancy   Psychiatric: He has a normal mood and affect. Judgment and thought content normal.  Nursing note and vitals reviewed.     Assessment & Plan:  1. Routine general medical examination at a health care facility - Labs reviewed with patient and all questions answered.  - Follow up in one year or sooner if needed - Continue to stay active and exercise   2. Pure hypercholesterolemia - Lipid panel WNL. Continue Lipitor  - Continue to eat healthy and exercise  3. BPH with obstruction/lower urinary tract symptoms - We  talked about medications and he does not find it an inconvenience to have to take medication daily. He would like to wait for this  - Follow up as needed 4. Erectile dysfunction, unspecified erectile dysfunction type  - tadalafil (CIALIS) 20 MG tablet; Take 0.5-1 tablets (10-20 mg total) by mouth every other day as needed for erectile dysfunction.  Dispense: 10 tablet; Refill: 11  5. Tinnitus of both ears - Stop ASA  - Follow up if no improvement. Will refer to Audiology if tinnitus resolved     Shirline Freesory Tanicia Wolaver, NP

## 2016-11-07 ENCOUNTER — Other Ambulatory Visit: Payer: Self-pay | Admitting: Family Medicine

## 2016-11-07 DIAGNOSIS — E7800 Pure hypercholesterolemia, unspecified: Secondary | ICD-10-CM

## 2016-11-07 MED ORDER — ATORVASTATIN CALCIUM 40 MG PO TABS: 40.0000 mg | ORAL_TABLET | Freq: Every day | ORAL | 3 refills | Status: DC

## 2017-02-09 ENCOUNTER — Encounter: Payer: Self-pay | Admitting: Adult Health

## 2017-09-04 ENCOUNTER — Ambulatory Visit (INDEPENDENT_AMBULATORY_CARE_PROVIDER_SITE_OTHER): Payer: Managed Care, Other (non HMO) | Admitting: Adult Health

## 2017-09-04 ENCOUNTER — Encounter: Payer: Self-pay | Admitting: Adult Health

## 2017-09-04 VITALS — BP 134/82 | Temp 98.0°F | Ht 71.5 in | Wt 199.0 lb

## 2017-09-04 DIAGNOSIS — G479 Sleep disorder, unspecified: Secondary | ICD-10-CM

## 2017-09-04 DIAGNOSIS — Z0000 Encounter for general adult medical examination without abnormal findings: Secondary | ICD-10-CM | POA: Diagnosis not present

## 2017-09-04 DIAGNOSIS — N529 Male erectile dysfunction, unspecified: Secondary | ICD-10-CM | POA: Diagnosis not present

## 2017-09-04 DIAGNOSIS — E7800 Pure hypercholesterolemia, unspecified: Secondary | ICD-10-CM | POA: Diagnosis not present

## 2017-09-04 DIAGNOSIS — Z125 Encounter for screening for malignant neoplasm of prostate: Secondary | ICD-10-CM

## 2017-09-04 LAB — LIPID PANEL
CHOL/HDL RATIO: 4
CHOLESTEROL: 148 mg/dL (ref 0–200)
HDL: 40.4 mg/dL (ref >39.00)
LDL Cholesterol: 88 mg/dL (ref 0–99)
NonHDL: 107.4
TRIGLYCERIDES: 99 mg/dL (ref 0.0–149.0)
VLDL: 19.8 mg/dL (ref 0.0–40.0)

## 2017-09-04 LAB — TSH: TSH: 2.78 u[IU]/mL (ref 0.35–4.50)

## 2017-09-04 LAB — CBC WITH DIFFERENTIAL/PLATELET
BASOS PCT: 0.7 % (ref 0.0–3.0)
Basophils Absolute: 0 10*3/uL (ref 0.0–0.1)
EOS PCT: 6.2 % — AB (ref 0.0–5.0)
Eosinophils Absolute: 0.4 10*3/uL (ref 0.0–0.7)
HEMATOCRIT: 48.7 % (ref 39.0–52.0)
Hemoglobin: 16.4 g/dL (ref 13.0–17.0)
LYMPHS PCT: 26.6 % (ref 12.0–46.0)
Lymphs Abs: 1.9 10*3/uL (ref 0.7–4.0)
MCHC: 33.7 g/dL (ref 30.0–36.0)
MCV: 91.4 fl (ref 78.0–100.0)
MONOS PCT: 9.5 % (ref 3.0–12.0)
Monocytes Absolute: 0.7 10*3/uL (ref 0.1–1.0)
Neutro Abs: 4 10*3/uL (ref 1.4–7.7)
Neutrophils Relative %: 57 % (ref 43.0–77.0)
Platelets: 198 10*3/uL (ref 150.0–400.0)
RBC: 5.33 Mil/uL (ref 4.22–5.81)
RDW: 13.5 % (ref 11.5–15.5)
WBC: 7 10*3/uL (ref 4.0–10.5)

## 2017-09-04 LAB — BASIC METABOLIC PANEL
BUN: 23 mg/dL (ref 6–23)
CHLORIDE: 104 meq/L (ref 96–112)
CO2: 29 mEq/L (ref 19–32)
Calcium: 9.3 mg/dL (ref 8.4–10.5)
Creatinine, Ser: 1.15 mg/dL (ref 0.40–1.50)
GFR: 68.04 mL/min (ref >60.00)
Glucose, Bld: 89 mg/dL (ref 70–99)
POTASSIUM: 4.4 meq/L (ref 3.5–5.1)
SODIUM: 141 meq/L (ref 135–145)

## 2017-09-04 LAB — HEPATIC FUNCTION PANEL
ALT: 22 U/L (ref 0–53)
AST: 23 U/L (ref 0–37)
Albumin: 4.2 g/dL (ref 3.5–5.2)
Alkaline Phosphatase: 78 U/L (ref 39–117)
Bilirubin, Direct: 0.1 mg/dL (ref 0.0–0.3)
TOTAL PROTEIN: 7.2 g/dL (ref 6.0–8.3)
Total Bilirubin: 0.9 mg/dL (ref 0.2–1.2)

## 2017-09-04 LAB — PSA: PSA: 1.81 ng/mL (ref 0.10–4.00)

## 2017-09-04 MED ORDER — SILDENAFIL CITRATE 50 MG PO TABS: 50.0000 mg | ORAL_TABLET | Freq: Every day | ORAL | 3 refills | Status: AC | PRN

## 2017-09-04 MED ORDER — ATORVASTATIN CALCIUM 40 MG PO TABS: 40.0000 mg | ORAL_TABLET | Freq: Every day | ORAL | 3 refills | Status: DC

## 2017-09-04 MED ORDER — ZOLPIDEM TARTRATE 10 MG PO TABS: 10.0000 mg | ORAL_TABLET | Freq: Every evening | ORAL | 0 refills | Status: DC | PRN

## 2017-09-04 NOTE — Progress Notes (Signed)
Subjective:    Patient ID: Darryl Scott, male    DOB: 1956-02-28, 62 y.o.   MRN: [id number]  HPI  Patient presents for yearly preventative medicine examination. He is a pleasant 62 year old male who  has a past medical history of Chicken pox, Colon polyps, Erectile dysfunction, and High cholesterol.  He takes Lipitor 40 mg for hyperlipidemia  Was prescribed Cialis 20 mg as needed for erectile dysfunction. Reports that this was over $800, so it was never filled.   All immunizations and health maintenance protocols were reviewed with the patient and needed orders were placed. Would like to have Shingrix   Appropriate screening laboratory values were ordered for the patient including screening of hyperlipidemia, renal function and hepatic function. If indicated by BPH, a PSA was ordered.  Medication reconciliation,  past medical history, social history, problem list and allergies were reviewed in detail with the patient  Goals were established with regard to weight loss, exercise, and  diet in compliance with medications  End of life planning was discussed.  He has an advanced directive and living will. He continues to mountain bike and hike   His last colonoscopy was in 2016.  He has up-to-date on routine dental and vision screens   He is going to be vacationing in [place] and would like something to help him sleep on the flight.   Review of Systems  Constitutional: Negative.   HENT: Positive for tinnitus (chronic ).   Eyes: Negative.   Respiratory: Negative.   Cardiovascular: Negative.   Gastrointestinal: Negative.   Endocrine: Negative.   Genitourinary: Negative.   Musculoskeletal: Negative.   Skin: Negative.   Allergic/Immunologic: Negative.   Neurological: Negative.   Hematological: Negative.   Psychiatric/Behavioral: Negative.   All other systems reviewed and are negative.  Past Medical History:  Diagnosis Date  . Chicken pox   . Colon polyps   . Erectile  dysfunction   . High cholesterol     Social History   Socioeconomic History  . Marital status: Married    Spouse name: Not on file  . Number of children: Not on file  . Years of education: Not on file  . Highest education level: Not on file  Occupational History  . Not on file  Social Needs  . Financial resource strain: Not on file  . Food insecurity:    Worry: Not on file    Inability: Not on file  . Transportation needs:    Medical: Not on file    Non-medical: Not on file  Tobacco Use  . Smoking status: Never Smoker  Substance and Sexual Activity  . Alcohol use: Yes    Alcohol/week: 0.0 oz    Comment: per pt 1 beer a week   . Drug use: No  . Sexual activity: Not on file  Lifestyle  . Physical activity:    Days per week: Not on file    Minutes per session: Not on file  . Stress: Not on file  Relationships  . Social connections:    Talks on phone: Not on file    Gets together: Not on file    Attends religious service: Not on file    Active member of club or organization: Not on file    Attends meetings of clubs or organizations: Not on file    Relationship status: Not on file  . Intimate partner violence:    Fear of current or ex partner: Not on file  Emotionally abused: Not on file    Physically abused: Not on file    Forced sexual activity: Not on file  Other Topics Concern  . 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     Past Surgical History:  Procedure Laterality Date  . GANGLION CYST EXCISION      Family History  Problem Relation Age of Onset  . Prostate cancer Father   . Elevated Lipids Father     No Known Allergies  Current Outpatient Medications on File Prior to Visit  Medication Sig Dispense Refill  . aspirin 81 MG tablet Take 81 mg by mouth daily.     No current facility-administered medications on file prior to  visit.     BP 134/82   Temp 98 F (36.7 C)   Ht 5' 11.5" (1.816 m)   Wt 199 lb (90.3 kg)   BMI 27.37 kg/m       Objective:   Physical Exam  Constitutional: He is oriented to person, place, and time. He appears well-developed and well-nourished. No distress.  HENT:  Head: Normocephalic and atraumatic.  Right Ear: External ear normal.  Left Ear: External ear normal.  Nose: Nose normal.  Mouth/Throat: Oropharynx is clear and moist. No oropharyngeal exudate.  Eyes: Pupils are equal, round, and reactive to light. Conjunctivae and EOM are normal. Right eye exhibits no discharge. Left eye exhibits no discharge. No scleral icterus.  Neck: Normal range of motion. Neck supple. No JVD present. No tracheal deviation present. No thyromegaly present.  Cardiovascular: Normal rate, regular rhythm, normal heart sounds and intact distal pulses. Exam reveals no gallop and no friction rub.  No murmur heard. Pulmonary/Chest: Effort normal and breath sounds normal. No stridor. No respiratory distress. He has no wheezes. He has no rales. He exhibits no tenderness.  Abdominal: Soft. Bowel sounds are normal. He exhibits no distension and no mass. There is no tenderness. There is no rebound and no guarding.  Genitourinary:  Genitourinary Comments: Will do PSA   Musculoskeletal: Normal range of motion. He exhibits no edema, tenderness or deformity.  Lymphadenopathy:    He has no cervical adenopathy.  Neurological: He is alert and oriented to person, place, and time. He has normal reflexes. He displays normal reflexes. No cranial nerve deficit. He exhibits normal muscle tone. Coordination normal.  Skin: Skin is warm and dry. No rash noted. He is not diaphoretic. No erythema. No pallor.  Scattered moles and AK's.   Psychiatric: He has a normal mood and affect. His behavior is normal. Judgment and thought content normal.  Nursing note and vitals reviewed.     Assessment & Plan:  1. Routine general medical  examination at a health care facility - Follow up in one year  - Continue to exercise and eat healthy  - Basic metabolic panel - CBC with Differential/Platelet - Hepatic function panel - Lipid panel - TSH  2. Pure hypercholesterolemia  - Basic metabolic panel - CBC with Differential/Platelet - Hepatic function panel - Lipid panel - TSH - atorvastatin (LIPITOR) 40 MG tablet; Take 1 tablet (40 mg total) by mouth daily.  Dispense: 90 tablet; Refill: 3  3. Prostate cancer screening  - PSA  4. Erectile dysfunction, unspecified erectile dysfunction type  - sildenafil (VIAGRA) 50 MG tablet; Take 1 tablet (50 mg total) by mouth  daily as needed for erectile dysfunction.  Dispense: 30 tablet; Refill: 3  5. Sleep disorder  - zolpidem (AMBIEN) 10 MG tablet; Take 1 tablet (10 mg total) by mouth at bedtime as needed for sleep.  Dispense: 15 tablet; Refill: 0   Shirline Freesory Izacc Demeyer, NP

## 2017-09-04 NOTE — Patient Instructions (Signed)
It was great seeing you you this morning .  I will follow up with you regarding your blood work   Follow up in one year or sooner if needed  Have a great vacation

## 2017-09-20 IMAGING — MR MR SHOULDER*L* W/O CM
4 of 5 series · 14 of 40 positions shown · non-contrast
Comparison: None.

CLINICAL DATA: Left shoulder pain worsening for 6 months.

EXAM:
MRI OF THE LEFT SHOULDER WITHOUT CONTRAST
TECHNIQUE: Multiplanar, multisequence MR imaging of the shoulder was performed.
No intravenous contrast was administered.

[Series 9: T2 fat-sat · axial · left · 3.0mm · 0.44mm/px · z∈[-57,+1]mm · 3 of 25 slices shown (1 of 3)]
[im 4/25]
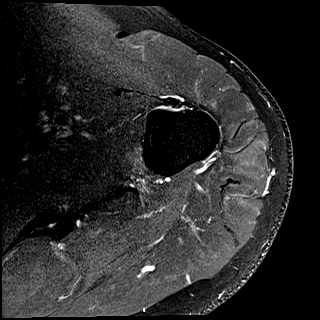
[im 14/25]
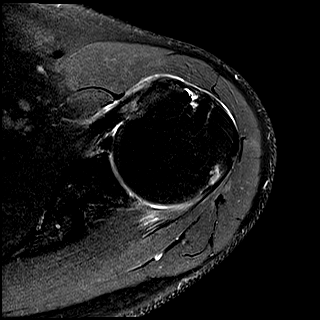
[im 21/25]
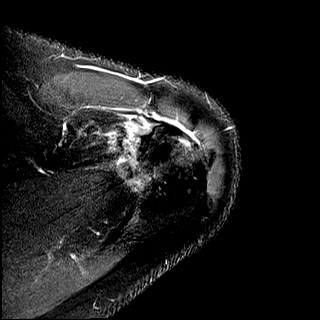

[Series 10: PD · oblique · left · 3.0mm · 0.18mm/px · 5 of 21 slices shown]
[im 1/21]
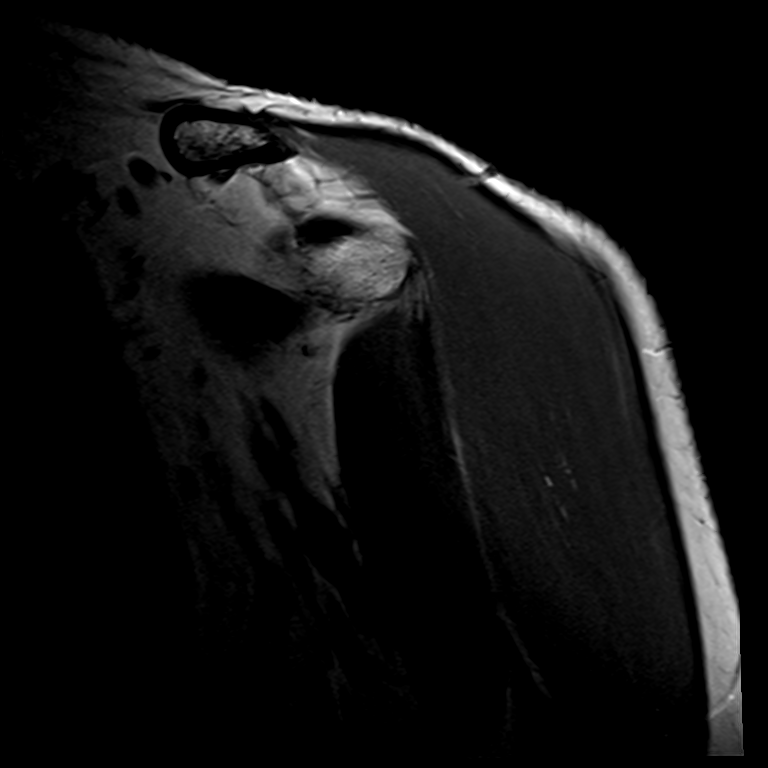
[im 3/21]
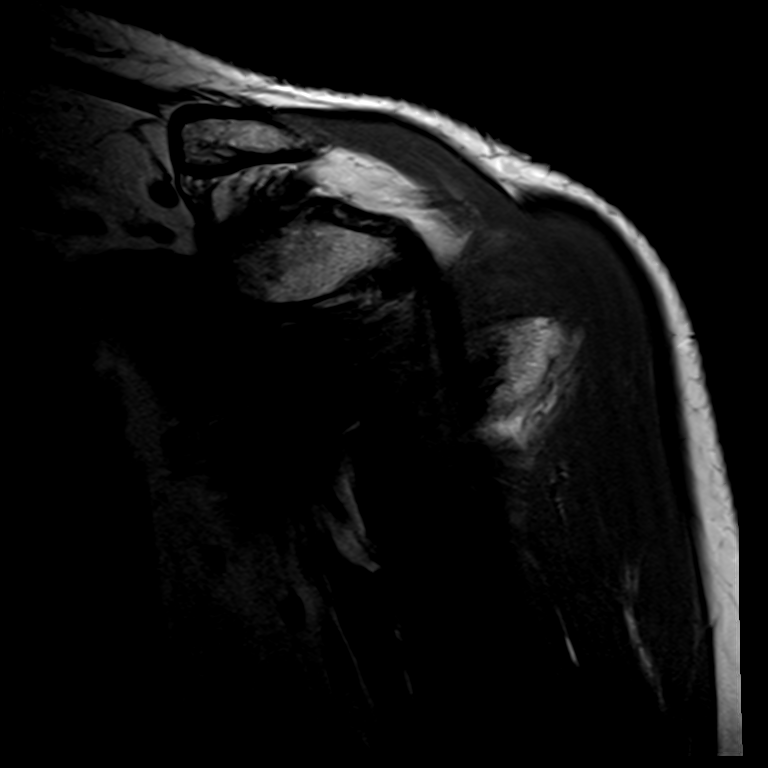
[im 6/21]
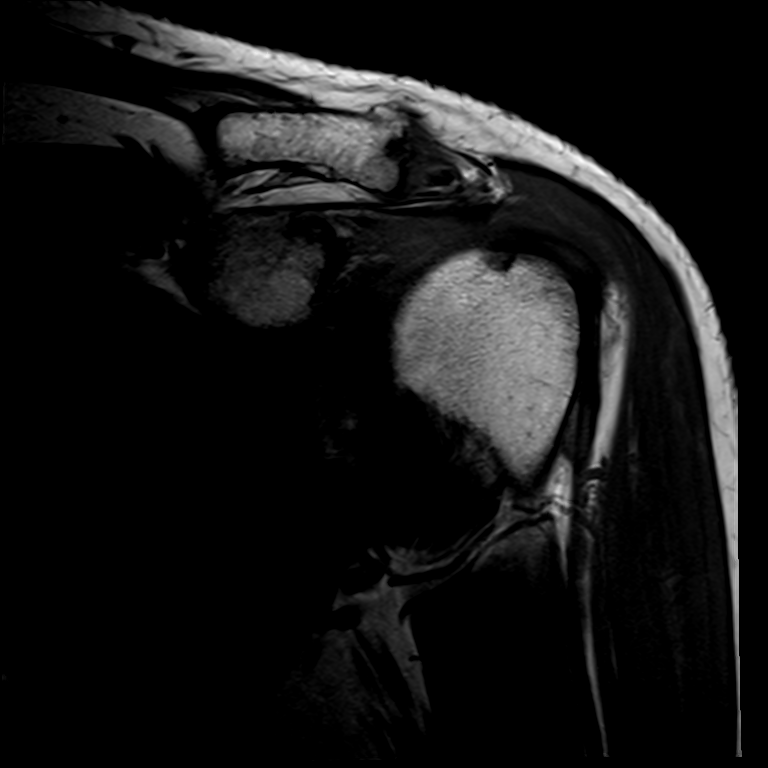
[im 12/21]
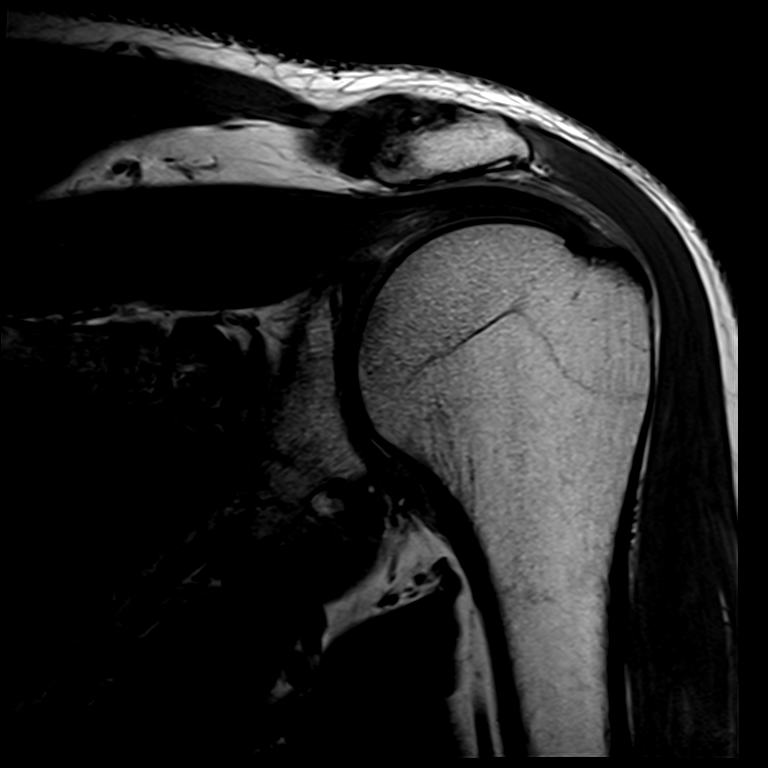
[im 18/21]
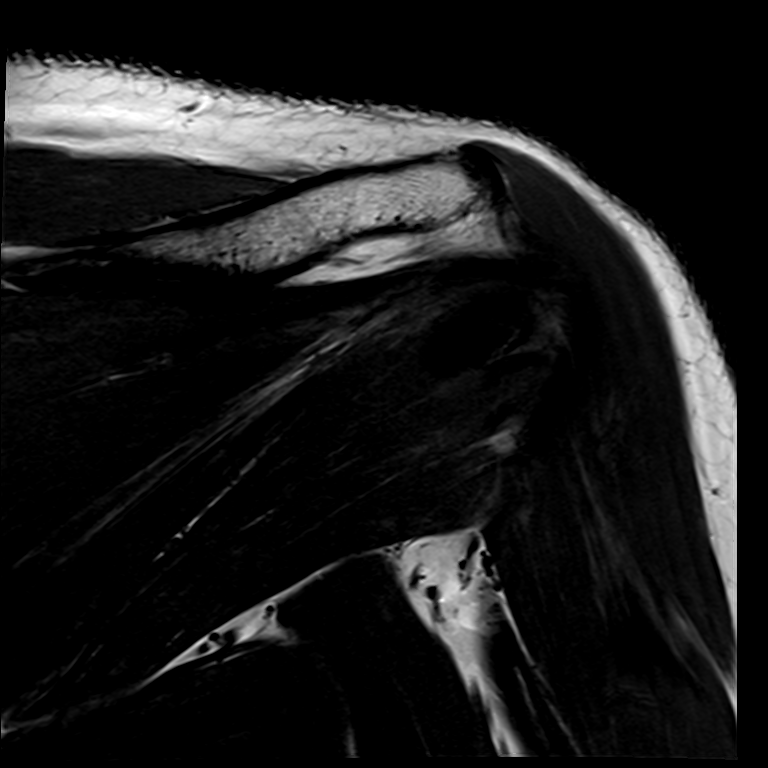

[Series 11: T2 fat-sat · oblique · left · 3.0mm · 0.22mm/px · 3 of 21 slices shown (2 of 3)]
[im 3/21]
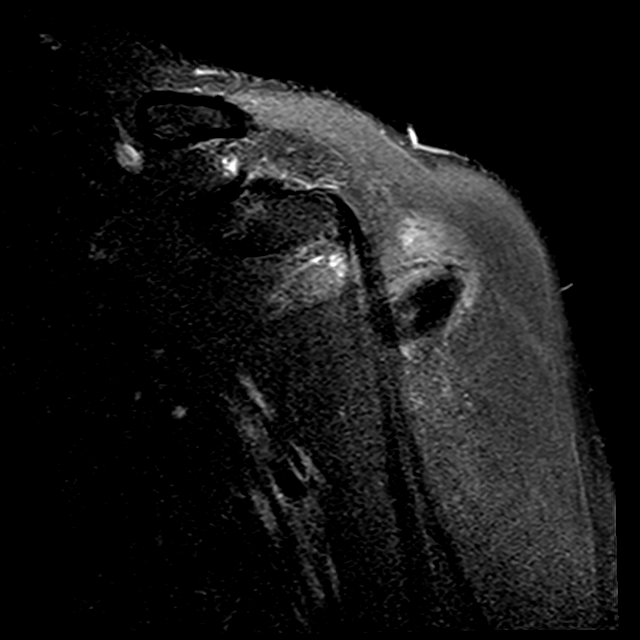
[im 12/21]
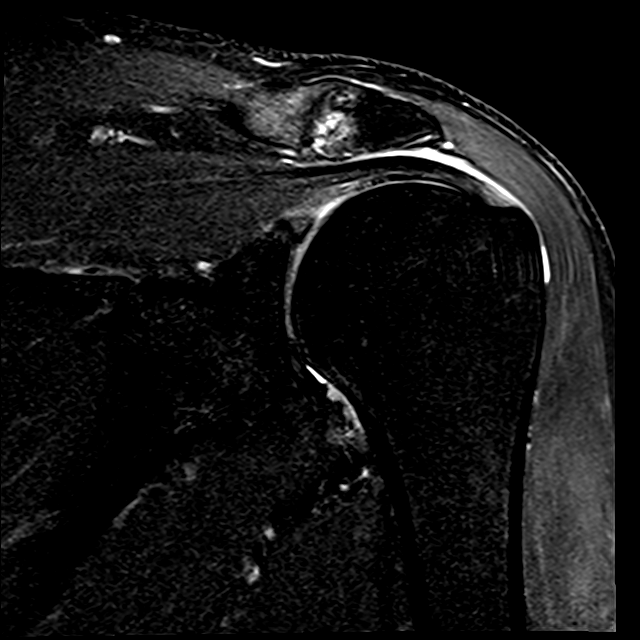
[im 18/21]
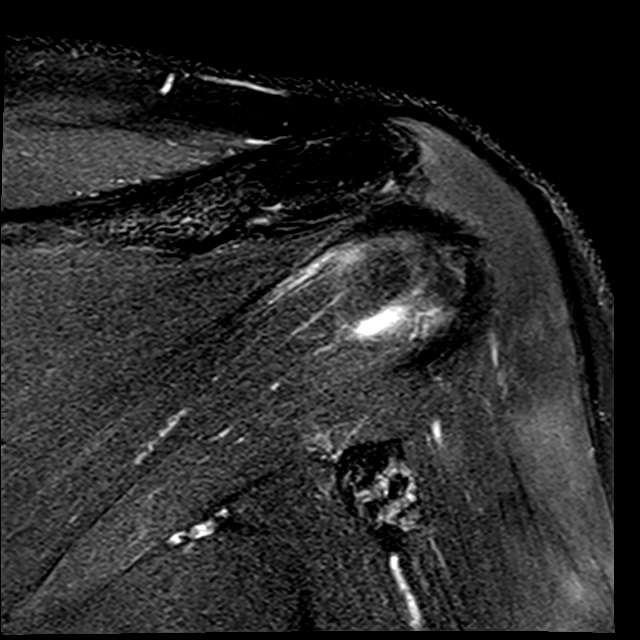

[Series 12: T2 fat-sat · sagittal · left · 3.3mm · 0.44mm/px · 3 of 21 slices shown (3 of 3)]
[im 3/21]
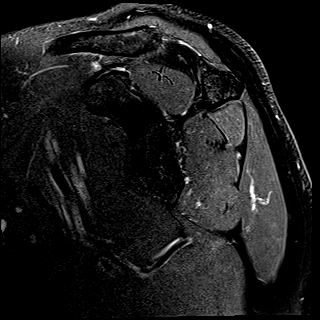
[im 12/21]
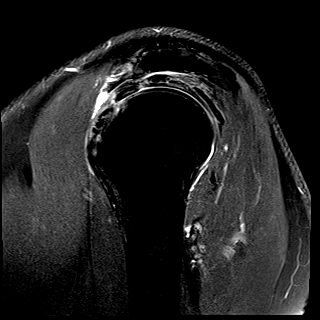
[im 18/21]
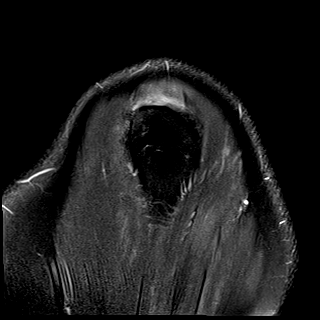

[14 of 40 positions shown; findings below may reference images not displayed]

FINDINGS: Rotator cuff: Mild tendinosis of the supraspinatus tendon with
fraying along the bursal surface. Infraspinatus tendon is intact.
Teres minor tendon is intact. Subscapularis tendon is intact.

Muscles: Minimal muscle edema at the musculotendinous junction of
the infraspinatus. No atrophy or fatty replacement of the muscles of
the rotator cuff.

Biceps long head: Mild tendinosis of the intraarticular portion of
the long head of the biceps tendon.

Acromioclavicular Joint: Moderate degenerative changes of the
acromioclavicular joint. Type II acromion. Small amount of
subacromial/ subdeltoid bursal fluid.

Glenohumeral Joint: No joint effusion.  No chondral defect.

Labrum: Grossly intact, but evaluation is limited by lack of
intraarticular fluid.

Bones: No focal marrow signal abnormality. No fracture dislocation.
IMPRESSION: 1. Mild tendinosis of the supraspinatus tendon with fraying along
the bursal surface.
2. Mild tendinosis of the intraarticular portion of the long head of
the biceps tendon.
3. Mild subacromial/subdeltoid bursitis.

## 2018-09-06 ENCOUNTER — Encounter: Payer: Self-pay | Admitting: Adult Health

## 2018-09-06 ENCOUNTER — Ambulatory Visit (INDEPENDENT_AMBULATORY_CARE_PROVIDER_SITE_OTHER): Payer: Managed Care, Other (non HMO) | Admitting: Adult Health

## 2018-09-06 ENCOUNTER — Other Ambulatory Visit: Payer: Self-pay

## 2018-09-06 DIAGNOSIS — E7800 Pure hypercholesterolemia, unspecified: Secondary | ICD-10-CM

## 2018-09-06 MED ORDER — ATORVASTATIN CALCIUM 40 MG PO TABS
40.0000 mg | ORAL_TABLET | Freq: Every day | ORAL | 3 refills | Status: AC
Start: 2018-09-06 — End: ?

## 2018-09-06 NOTE — Progress Notes (Signed)
Virtual Visit via Video Note  I connected with Darryl Scott on 09/06/18 at  8:30 AM EDT by a video enabled telemedicine application and verified that I am speaking with the correct person using two identifiers.  Location patient: home Location provider:work or home office Persons participating in the virtual visit: patient, provider  I discussed the limitations of evaluation and management by telemedicine and the availability of in person appointments. The patient expressed understanding and agreed to proceed.   HPI:  Darryl Scott has decided to retire and he is building a house in [place] in order to be closer to his family in [place]. He is needing a prescription for Lipitor.   Lab Results  Component Value Date   CHOL 148 09/04/2017   HDL 40.40 09/04/2017   LDLCALC 88 09/04/2017   TRIG 99.0 09/04/2017   CHOLHDL 4 09/04/2017     He voices no complaints and reports that he is doing well.   ROS: See pertinent positives and negatives per HPI.  Past Medical History:  Diagnosis Date  . Chicken pox   . Colon polyps   . Erectile dysfunction   . High cholesterol     Past Surgical History:  Procedure Laterality Date  . GANGLION CYST EXCISION      Family History  Problem Relation Age of Onset  . Prostate cancer Father   . Elevated Lipids Father       Current Outpatient Medications:  .  aspirin 81 MG tablet, Take 81 mg by mouth daily., Disp: , Rfl:  .  atorvastatin (LIPITOR) 40 MG tablet, Take 1 tablet (40 mg total) by mouth daily., Disp: 90 tablet, Rfl: 3 .  sildenafil (VIAGRA) 50 MG tablet, Take 1 tablet (50 mg total) by mouth daily as needed for erectile dysfunction., Disp: 30 tablet, Rfl: 3  EXAM:  VITALS per patient if applicable:  GENERAL: alert, oriented, appears well and in no acute distress  HEENT: atraumatic, conjunttiva clear, no obvious abnormalities on inspection of external nose and ears  NECK: normal movements of the head and neck  LUNGS: on inspection no  signs of respiratory distress, breathing rate appears normal, no obvious gross SOB, gasping or wheezing  CV: no obvious cyanosis  MS: moves all visible extremities without noticeable abnormality  PSYCH/NEURO: pleasant and cooperative, no obvious depression or anxiety, speech and thought processing grossly intact  ASSESSMENT AND PLAN:  Discussed the following assessment and plan:  Pure hypercholesterolemia - Plan: atorvastatin (LIPITOR) 40 MG tablet  Wished him the best of luck and to have fun with retirement     I discussed the assessment and treatment plan with the patient. The patient was provided an opportunity to ask questions and all were answered. The patient agreed with the plan and demonstrated an understanding of the instructions.   The patient was advised to call back or seek an in-person evaluation if the symptoms worsen or if the condition fails to improve as anticipated.   Shirline Frees, NP

## 2018-09-27 ENCOUNTER — Other Ambulatory Visit: Payer: Self-pay | Admitting: Adult Health

## 2018-09-27 ENCOUNTER — Telehealth: Payer: Self-pay | Admitting: Family Medicine

## 2018-09-27 DIAGNOSIS — N138 Other obstructive and reflux uropathy: Secondary | ICD-10-CM

## 2018-09-27 DIAGNOSIS — E7800 Pure hypercholesterolemia, unspecified: Secondary | ICD-10-CM

## 2018-09-27 NOTE — Telephone Encounter (Signed)
Darryl Scott, are you able to order lab work for this pt?

## 2018-09-27 NOTE — Telephone Encounter (Signed)
Copied from CRM [id number]. Topic: Appointment Scheduling - Scheduling Inquiry for Clinic >> Sep 27, 2018  8:18 AM Crist Infante wrote: Reason for CRM: Pt states he had virtual cpe on 09/06/18 with Kandee Keen and forgot to ask for order to have labs.  Pt would like to have cpe labs done before he moves out of state. Please advise, thanks

## 2018-09-27 NOTE — Telephone Encounter (Signed)
I have entered future labs.

## 2018-10-02 ENCOUNTER — Other Ambulatory Visit: Payer: Self-pay

## 2018-10-02 ENCOUNTER — Other Ambulatory Visit (INDEPENDENT_AMBULATORY_CARE_PROVIDER_SITE_OTHER): Payer: 59

## 2018-10-02 DIAGNOSIS — E7800 Pure hypercholesterolemia, unspecified: Secondary | ICD-10-CM

## 2018-10-02 DIAGNOSIS — N401 Enlarged prostate with lower urinary tract symptoms: Secondary | ICD-10-CM | POA: Diagnosis not present

## 2018-10-02 DIAGNOSIS — N138 Other obstructive and reflux uropathy: Secondary | ICD-10-CM | POA: Diagnosis not present

## 2018-10-02 LAB — CBC WITH DIFFERENTIAL/PLATELET
Basophils Absolute: 0.1 10*3/uL (ref 0.0–0.1)
Basophils Relative: 0.7 % (ref 0.0–3.0)
Eosinophils Absolute: 0.4 10*3/uL (ref 0.0–0.7)
Eosinophils Relative: 5.3 % — ABNORMAL HIGH (ref 0.0–5.0)
HCT: 47.9 % (ref 39.0–52.0)
Hemoglobin: 16.3 g/dL (ref 13.0–17.0)
Lymphocytes Relative: 27.5 % (ref 12.0–46.0)
Lymphs Abs: 2.1 10*3/uL (ref 0.7–4.0)
MCHC: 34.1 g/dL (ref 30.0–36.0)
MCV: 91.5 fl (ref 78.0–100.0)
Monocytes Absolute: 0.6 10*3/uL (ref 0.1–1.0)
Monocytes Relative: 7.6 % (ref 3.0–12.0)
Neutro Abs: 4.5 10*3/uL (ref 1.4–7.7)
Neutrophils Relative %: 58.9 % (ref 43.0–77.0)
Platelets: 209 10*3/uL (ref 150.0–400.0)
RBC: 5.24 Mil/uL (ref 4.22–5.81)
RDW: 13.3 % (ref 11.5–15.5)
WBC: 7.7 10*3/uL (ref 4.0–10.5)

## 2018-10-02 LAB — LIPID PANEL
Cholesterol: 137 mg/dL (ref 0–200)
HDL: 39.1 mg/dL (ref >39.00)
LDL Cholesterol: 81 mg/dL (ref 0–99)
NonHDL: 98.07
Total CHOL/HDL Ratio: 4
Triglycerides: 83 mg/dL (ref 0.0–149.0)
VLDL: 16.6 mg/dL (ref 0.0–40.0)

## 2018-10-02 LAB — COMPREHENSIVE METABOLIC PANEL
ALT: 17 U/L (ref 0–53)
AST: 23 U/L (ref 0–37)
Albumin: 4.2 g/dL (ref 3.5–5.2)
Alkaline Phosphatase: 92 U/L (ref 39–117)
BUN: 17 mg/dL (ref 6–23)
CO2: 30 mEq/L (ref 19–32)
Calcium: 9 mg/dL (ref 8.4–10.5)
Chloride: 102 mEq/L (ref 96–112)
Creatinine, Ser: 1.09 mg/dL (ref 0.40–1.50)
GFR: 67.87 mL/min (ref >60.00)
Glucose, Bld: 87 mg/dL (ref 70–99)
Potassium: 4.6 mEq/L (ref 3.5–5.1)
Sodium: 139 mEq/L (ref 135–145)
Total Bilirubin: 1 mg/dL (ref 0.2–1.2)
Total Protein: 7.3 g/dL (ref 6.0–8.3)

## 2018-10-02 LAB — TSH: TSH: 2.35 u[IU]/mL (ref 0.35–4.50)

## 2018-10-02 LAB — PSA: PSA: 1.92 ng/mL (ref 0.10–4.00)

## 2018-11-18 ENCOUNTER — Telehealth: Payer: Self-pay | Admitting: Adult Health

## 2018-11-18 NOTE — Telephone Encounter (Signed)
Opened in error

## 2018-11-20 ENCOUNTER — Encounter: Payer: Self-pay | Admitting: Adult Health
